# Patient Record
Sex: Male | Born: 1970 | Race: Asian | Hispanic: No | Marital: Married | Smoking: Never smoker
Health system: Southern US, Community
[De-identification: ages and names within clinical notes are randomized; demographics above are authoritative.]

---

## 2013-11-19 ENCOUNTER — Encounter (HOSPITAL_COMMUNITY): Payer: Self-pay | Admitting: Emergency Medicine

## 2013-11-19 ENCOUNTER — Emergency Department (HOSPITAL_COMMUNITY)
Admission: EM | Admit: 2013-11-19 | Discharge: 2013-11-19 | Disposition: A | Discharge status: Home / Self Care | Payer: Medicaid Other | Source: Home / Self Care | Attending: Family Medicine | Admitting: Family Medicine

## 2013-11-19 DIAGNOSIS — S39012A Strain of muscle, fascia and tendon of lower back, initial encounter: Secondary | ICD-10-CM

## 2013-11-19 DIAGNOSIS — X58XXXA Exposure to other specified factors, initial encounter: Secondary | ICD-10-CM

## 2013-11-19 DIAGNOSIS — S335XXA Sprain of ligaments of lumbar spine, initial encounter: Secondary | ICD-10-CM

## 2013-11-19 MED ORDER — METAXALONE 800 MG PO TABS: 800.0000 mg | ORAL_TABLET | Freq: Three times a day (TID) | ORAL | Status: AC

## 2013-11-19 NOTE — ED Provider Notes (Signed)
CSN: [id number]     Arrival date & time 11/19/13  1001 History   First MD Initiated Contact with Patient 11/19/13 1057     Chief Complaint  Patient presents with  . Back Pain   (Consider location/radiation/quality/duration/timing/severity/associated sxs/prior Treatment) Patient is a 43 y.o. male presenting with back pain. The history is provided by the patient. The history is limited by a language barrier. Language interpreter used: friend translated.  Back Pain Location:  Lumbar spine Quality:  Stiffness Radiates to:  Does not radiate Pain severity:  Mild Progression:  Unchanged Chronicity:  Chronic Context: lifting heavy objects   Context comment:  Hurt back 10 yrs ago in refugee camp in [place], has hurt since. no neuro sx. Relieved by:  None tried Worsened by:  Nothing tried Ineffective treatments:  None tried Associated symptoms: no abdominal pain, no leg pain, no numbness, no pelvic pain and no tingling     History reviewed. No pertinent past medical history. History reviewed. No pertinent past surgical history. No family history on file. History  Substance Use Topics  . Smoking status: Never Smoker   . Smokeless tobacco: Not on file  . Alcohol Use: No    Review of Systems  Gastrointestinal: Negative.  Negative for abdominal pain.  Genitourinary: Negative.  Negative for pelvic pain.  Musculoskeletal: Positive for back pain. Negative for gait problem, joint swelling and myalgias.  Skin: Negative.   Neurological: Negative for tingling and numbness.    Allergies  Review of patient's allergies indicates no known allergies.  Home Medications   Prior to Admission medications   Medication Sig Start Date End Date Taking? Authorizing Provider  metaxalone (SKELAXIN) 800 MG tablet Take 1 tablet (800 mg total) by mouth 3 (three) times daily. As muscle relaxer. 11/19/13   Linna HoffJames D Chrisette Man, MD   BP 130/87  Pulse 67  Temp(Src) 99.2 F (37.3 C) (Oral)  Resp 14  SpO2  100% Physical Exam  Nursing note and vitals reviewed. Constitutional: He is oriented to person, place, and time. He appears well-developed and well-nourished.  Neck: Normal range of motion. Neck supple.  Abdominal: Soft. Bowel sounds are normal.  Musculoskeletal: He exhibits tenderness.       Lumbar back: He exhibits tenderness and pain. He exhibits normal range of motion, no spasm and normal pulse.  Neurological: He is alert and oriented to person, place, and time.  Skin: Skin is warm and dry.    ED Course  Procedures (including critical care time) Labs Review Labs Reviewed - No data to display  Imaging Review No results found.   MDM   1. Low back strain, initial encounter        Linna HoffJames D Xian Apostol, MD 11/19/13 1121

## 2013-11-19 NOTE — ED Notes (Signed)
Via Clydie BraunKaren interpreter (friend) ... Pt c/o lower back pain x10 years Arrived from [place] x1 month ago Denies urinary sx Alert w/no signs of acute distress.

## 2013-12-02 ENCOUNTER — Ambulatory Visit: Payer: Self-pay

## 2013-12-26 ENCOUNTER — Ambulatory Visit: Payer: Medicaid Other | Attending: Internal Medicine | Admitting: Internal Medicine

## 2013-12-26 ENCOUNTER — Encounter: Payer: Self-pay | Admitting: Internal Medicine

## 2013-12-26 VITALS — BP 131/91 | HR 79 | Temp 97.7°F | Resp 16 | Ht 64.0 in | Wt 129.0 lb

## 2013-12-26 DIAGNOSIS — R5382 Chronic fatigue, unspecified: Secondary | ICD-10-CM | POA: Insufficient documentation

## 2013-12-26 DIAGNOSIS — R5383 Other fatigue: Secondary | ICD-10-CM | POA: Diagnosis not present

## 2013-12-26 DIAGNOSIS — M545 Low back pain, unspecified: Secondary | ICD-10-CM | POA: Diagnosis not present

## 2013-12-26 DIAGNOSIS — R5381 Other malaise: Secondary | ICD-10-CM | POA: Diagnosis not present

## 2013-12-26 LAB — CBC WITH DIFFERENTIAL/PLATELET
BASOS ABS: 0 10*3/uL (ref 0.0–0.1)
Basophils Relative: 0 % (ref 0–1)
EOS PCT: 3 % (ref 0–5)
Eosinophils Absolute: 0.2 10*3/uL (ref 0.0–0.7)
HEMATOCRIT: 39.1 % (ref 39.0–52.0)
Hemoglobin: 14 g/dL (ref 13.0–17.0)
LYMPHS PCT: 28 % (ref 12–46)
Lymphs Abs: 1.6 10*3/uL (ref 0.7–4.0)
MCH: 30.2 pg (ref 26.0–34.0)
MCHC: 35.8 g/dL (ref 30.0–36.0)
MCV: 84.3 fL (ref 78.0–100.0)
Monocytes Absolute: 0.4 10*3/uL (ref 0.1–1.0)
Monocytes Relative: 7 % (ref 3–12)
Neutro Abs: 3.5 10*3/uL (ref 1.7–7.7)
Neutrophils Relative %: 62 % (ref 43–77)
Platelets: 210 10*3/uL (ref 150–400)
RBC: 4.64 MIL/uL (ref 4.22–5.81)
RDW: 13.5 % (ref 11.5–15.5)
WBC: 5.7 10*3/uL (ref 4.0–10.5)

## 2013-12-26 LAB — POCT GLYCOSYLATED HEMOGLOBIN (HGB A1C): HEMOGLOBIN A1C: 5.5

## 2013-12-26 MED ORDER — NAPROXEN 500 MG PO TABS: 500.0000 mg | ORAL_TABLET | Freq: Two times a day (BID) | ORAL | Status: DC

## 2013-12-26 NOTE — Progress Notes (Signed)
Pt here to establish care for chronic lower back pain,nonradiating.denies injury States pain has been ongoing in prior country Pt c/o allergy symptoms C/o urine frequency,dark yellow Clydie BraunKaren interpretor present

## 2013-12-26 NOTE — Progress Notes (Signed)
Patient ID: Jose Gilmore, male   DOB: January 31, 1971, 43 y.o.   MRN: [id number]   Jose Gilmore, is a 43 y.o. male  [id number]  [id number]  DOB - January 31, 1971  CC:  Chief Complaint  Patient presents with  . Establish Care  . Back Pain  . Urinary Frequency  . Sinusitis       HPI: Jose Gilmore is a 43 y.o. male here today to establish medical care. Patient recently relocated from [place] ago, he has been experiencing low back pain for about 10 years but no significant finding from previous health care visit. Back pain is rated about 8/10, associated with some stiffness no radiation, he remembered in about 10 years ago while lifting objects heard a click sound, this happened at the refugee camp in [place]. Patient is not on any medication, has no significant past medical history. No urinary or fecal incontinence, no tingling or weakness in the lower extremities, patient is able to walk long distance though with pain in the back. Patient does not smoke cigarettes he does not drink alcohol. Patient has No headache, No chest pain, No abdominal pain - No Nausea, No new weakness tingling or numbness, No Cough - SOB.  No Known Allergies History reviewed. No pertinent past medical history. Current Outpatient Prescriptions on File Prior to Visit  Medication Sig Dispense Refill  . metaxalone (SKELAXIN) 800 MG tablet Take 1 tablet (800 mg total) by mouth 3 (three) times daily. As muscle relaxer.  30 tablet  0   No current facility-administered medications on file prior to visit.   History reviewed. No pertinent family history. History   Social History  . Marital Status: Married    Spouse Name: N/A    Number of Children: N/A  . Years of Education: N/A   Occupational History  . Not on file.   Social History Main Topics  . Smoking status: Never Smoker   . Smokeless tobacco: Not on file  . Alcohol Use: No  . Drug Use: No  . Sexual Activity: Not on file   Other  Topics Concern  . Not on file   Social History Narrative  . No narrative on file    Review of Systems: Constitutional: Negative for fever, chills, diaphoresis, activity change, appetite change and fatigue. HENT: Negative for ear pain, nosebleeds, congestion, facial swelling, rhinorrhea, neck pain, neck stiffness and ear discharge.  Eyes: Negative for pain, discharge, redness, itching and visual disturbance. Respiratory: Negative for cough, choking, chest tightness, shortness of breath, wheezing and stridor.  Cardiovascular: Negative for chest pain, palpitations and leg swelling. Gastrointestinal: Negative for abdominal distention. Genitourinary: Negative for dysuria, urgency, frequency, hematuria, flank pain, decreased urine volume, difficulty urinating and dyspareunia.  Musculoskeletal: Positive for back pain, negative for joint swelling, arthralgia and gait problem. Neurological: Negative for dizziness, tremors, seizures, syncope, facial asymmetry, speech difficulty, weakness, light-headedness, numbness and headaches.  Hematological: Negative for adenopathy. Does not bruise/bleed easily. Psychiatric/Behavioral: Negative for hallucinations, behavioral problems, confusion, dysphoric mood, decreased concentration and agitation.    Objective:   Filed Vitals:   12/26/13 1652  BP: 131/91  Pulse: 79  Temp: 97.7 F (36.5 C)  Resp: 16    Physical Exam: Constitutional: Patient appears well-developed and well-nourished. No distress. HENT: Normocephalic, atraumatic, External right and left ear normal. Oropharynx is clear and moist.  Eyes: Conjunctivae and EOM are normal. PERRLA, no scleral icterus. Neck: Normal ROM. Neck supple. No JVD. No tracheal deviation. No thyromegaly. CVS:  RRR, S1/S2 +, no murmurs, no gallops, no carotid bruit.  Pulmonary: Effort and breath sounds normal, no stridor, rhonchi, wheezes, rales.  Abdominal: Soft. BS +, no distension, tenderness, rebound or guarding.    Musculoskeletal: Normal range of motion. No edema and no tenderness. Negative leg raising sign  Lymphadenopathy: No lymphadenopathy noted, cervical, inguinal or axillary Neuro: Alert. Normal reflexes, muscle tone coordination. No cranial nerve deficit. Skin: Skin is warm and dry. No rash noted. Not diaphoretic. No erythema. No pallor. Psychiatric: Normal mood and affect. Behavior, judgment, thought content normal.  No results found for this basename: WBC, HGB, HCT, MCV, PLT   No results found for this basename: CREATININE, BUN, NA, K, CL, CO2    No results found for this basename: HGBA1C   Lipid Panel  No results found for this basename: chol, trig, hdl, cholhdl, vldl, ldlcalc       Assessment and plan:   1. Bilateral low back pain without sciatica  - CBC with Differential - COMPLETE METABOLIC PANEL WITH GFR - POCT glycosylated hemoglobin (Hb A1C) - Lipid panel - TSH - Urinalysis, Complete  - naproxen (NAPROSYN) 500 MG tablet; Take 1 tablet (500 mg total) by mouth 2 (two) times daily with a meal.  Dispense: 30 tablet; Refill: 0  2. Chronic fatigue  To review laboratory tests results especially TSH and CBC Patient was extensively counseled about nutrition and exercise  Interpreter was used to communicate directly with patient for the entire encounter including providing detailed patient instructions.   Return in about 6 months (around 06/28/2014), or if symptoms worsen or fail to improve, for Follow up Pain and comorbidities.  The patient was given clear instructions to go to ER or return to medical center if symptoms don't improve, worsen or new problems develop. The patient verbalized understanding. The patient was told to call to get lab results if they haven't heard anything in the next week.     This note has been created with Education officer, environmental. Any transcriptional errors are unintentional.    Jeanann Lewandowsky, MD, MHA,  FACP, FAAP [facility] [place], [place] [phone]   12/26/2013, 5:21 PM

## 2013-12-26 NOTE — Patient Instructions (Signed)
Back Pain, Adult Low back pain is very common. About 1 in 5 people have back pain.The cause of low back pain is rarely dangerous. The pain often gets better over time.About half of people with a sudden onset of back pain feel better in just 2 weeks. About 8 in 10 people feel better by 6 weeks.  CAUSES Some common causes of back pain include:  Strain of the muscles or ligaments supporting the spine.  Wear and tear (degeneration) of the spinal discs.  Arthritis.  Direct injury to the back. DIAGNOSIS Most of the time, the direct cause of low back pain is not known.However, back pain can be treated effectively even when the exact cause of the pain is unknown.Answering your caregiver's questions about your overall health and symptoms is one of the most accurate ways to make sure the cause of your pain is not dangerous. If your caregiver needs more information, he or she may order lab work or imaging tests (X-rays or MRIs).However, even if imaging tests show changes in your back, this usually does not require surgery. HOME CARE INSTRUCTIONS For many people, back pain returns.Since low back pain is rarely dangerous, it is often a condition that people can learn to manageon their own.   Remain active. It is stressful on the back to sit or stand in one place. Do not sit, drive, or stand in one place for more than 30 minutes at a time. Take short walks on level surfaces as soon as pain allows.Try to increase the length of time you walk each day.  Do not stay in bed.Resting more than 1 or 2 days can delay your recovery.  Do not avoid exercise or work.Your body is made to move.It is not dangerous to be active, even though your back may hurt.Your back will likely heal faster if you return to being active before your pain is gone.  Pay attention to your body when you bend and lift. Many people have less discomfortwhen lifting if they bend their knees, keep the load close to their bodies,and  avoid twisting. Often, the most comfortable positions are those that put less stress on your recovering back.  Find a comfortable position to sleep. Use a firm mattress and lie on your side with your knees slightly bent. If you lie on your back, put a pillow under your knees.  Only take over-the-counter or prescription medicines as directed by your caregiver. Over-the-counter medicines to reduce pain and inflammation are often the most helpful.Your caregiver may prescribe muscle relaxant drugs.These medicines help dull your pain so you can more quickly return to your normal activities and healthy exercise.  Put ice on the injured area.  Put ice in a plastic bag.  Place a towel between your skin and the bag.  Leave the ice on for 15-20 minutes, 03-04 times a day for the first 2 to 3 days. After that, ice and heat may be alternated to reduce pain and spasms.  Ask your caregiver about trying back exercises and gentle massage. This may be of some benefit.  Avoid feeling anxious or stressed.Stress increases muscle tension and can worsen back pain.It is important to recognize when you are anxious or stressed and learn ways to manage it.Exercise is a great option. SEEK MEDICAL CARE IF:  You have pain that is not relieved with rest or medicine.  You have pain that does not improve in 1 week.  You have new symptoms.  You are generally not feeling well. SEEK   IMMEDIATE MEDICAL CARE IF:   You have pain that radiates from your back into your legs.  You develop new bowel or bladder control problems.  You have unusual weakness or numbness in your arms or legs.  You develop nausea or vomiting.  You develop abdominal pain.  You feel faint. Document Released: 04/25/2005 Document Revised: 10/25/2011 Document Reviewed: 08/27/2013 ExitCare Patient Information 2015 [employer]. This information is not intended to replace advice given to you by your health care provider. Make sure you  discuss any questions you have with your health care provider.  

## 2013-12-27 LAB — COMPLETE METABOLIC PANEL WITH GFR
ALT: 15 U/L (ref 0–53)
AST: 19 U/L (ref 0–37)
Albumin: 4.4 g/dL (ref 3.5–5.2)
Alkaline Phosphatase: 66 U/L (ref 39–117)
BUN: 17 mg/dL (ref 6–23)
CALCIUM: 9.3 mg/dL (ref 8.4–10.5)
CHLORIDE: 102 meq/L (ref 96–112)
CO2: 27 meq/L (ref 19–32)
Creat: 1.12 mg/dL (ref 0.50–1.35)
GFR, Est Non African American: 81 mL/min
Glucose, Bld: 130 mg/dL — ABNORMAL HIGH (ref 70–99)
Potassium: 4 mEq/L (ref 3.5–5.3)
Sodium: 138 mEq/L (ref 135–145)
Total Bilirubin: 0.4 mg/dL (ref 0.2–1.2)
Total Protein: 7.1 g/dL (ref 6.0–8.3)

## 2013-12-27 LAB — URINALYSIS, COMPLETE
Bacteria, UA: NONE SEEN
Bilirubin Urine: NEGATIVE
CASTS: NONE SEEN
Crystals: NONE SEEN
GLUCOSE, UA: NEGATIVE mg/dL
Hgb urine dipstick: NEGATIVE
Ketones, ur: NEGATIVE mg/dL
LEUKOCYTES UA: NEGATIVE
Nitrite: NEGATIVE
PH: 5.5 (ref 5.0–8.0)
Protein, ur: NEGATIVE mg/dL
SQUAMOUS EPITHELIAL / LPF: NONE SEEN
Specific Gravity, Urine: 1.029 (ref 1.005–1.030)
Urobilinogen, UA: 0.2 mg/dL (ref 0.0–1.0)

## 2013-12-27 LAB — LIPID PANEL
CHOLESTEROL: 234 mg/dL — AB (ref 0–200)
HDL: 51 mg/dL (ref >39)
Total CHOL/HDL Ratio: 4.6 Ratio
Triglycerides: 534 mg/dL — ABNORMAL HIGH (ref <150)

## 2013-12-27 LAB — TSH: TSH: 3.691 u[IU]/mL (ref 0.350–4.500)

## 2013-12-30 ENCOUNTER — Telehealth: Payer: Self-pay | Admitting: Emergency Medicine

## 2013-12-30 MED ORDER — GEMFIBROZIL 600 MG PO TABS: 600.0000 mg | ORAL_TABLET | Freq: Two times a day (BID) | ORAL | Status: DC

## 2013-12-30 NOTE — Telephone Encounter (Signed)
Pt family member Hser given lab results per permission for Translation.medication ordered and e-scribed to [facility] pharmacy] Script Gemfibrozil 600 mg tab BID sent to pharmacy

## 2013-12-30 NOTE — Telephone Encounter (Signed)
Message copied by Darlis Loan on Mon Dec 30, 2013  4:06 PM ------      Message from: Quentin Angst      Created: Fri Dec 27, 2013  9:28 AM       Please inform patient that his laboratory tests results are mostly within normal limit except for his triglyceride, level which is a form of cholesterol. His urine test is normal with no evidence of infection. We'll prescribe medication for his triglyceride and recheck again in 6 months.            Please correlate gemfibrozil 600 mg tablet by mouth twice a day, quantity 60 tablets with 2 refills ------

## 2014-01-07 ENCOUNTER — Ambulatory Visit (HOSPITAL_COMMUNITY)
Admission: RE | Admit: 2014-01-07 | Discharge: 2014-01-07 | Disposition: A | Discharge status: Home / Self Care | Payer: Medicaid Other | Source: Ambulatory Visit | Attending: Internal Medicine | Admitting: Internal Medicine

## 2014-01-07 DIAGNOSIS — R5383 Other fatigue: Secondary | ICD-10-CM

## 2014-01-07 DIAGNOSIS — R05 Cough: Secondary | ICD-10-CM | POA: Insufficient documentation

## 2014-01-07 DIAGNOSIS — R059 Cough, unspecified: Secondary | ICD-10-CM | POA: Diagnosis not present

## 2014-01-07 DIAGNOSIS — R5381 Other malaise: Secondary | ICD-10-CM | POA: Diagnosis not present

## 2014-01-07 DIAGNOSIS — R5382 Chronic fatigue, unspecified: Secondary | ICD-10-CM

## 2014-01-14 ENCOUNTER — Telehealth: Payer: Self-pay | Admitting: Emergency Medicine

## 2014-01-14 NOTE — Telephone Encounter (Signed)
Message copied by Darlis Loan on Tue Jan 14, 2014  2:01 PM ------      Message from: Quentin Angst      Created: Tue Jan 14, 2014  9:42 AM       Please inform patient that his chest x-ray is normal ------

## 2014-01-16 NOTE — Telephone Encounter (Signed)
Mr. Jose Gilmore on behalf of the Paitient (brother in law) would like to know the results of the X-rays done last week and if the Patient needs to come for a doctor's visit and when. Patient referred medication is not helping with back pain and limitations like he cannot stand up for a long time, sit down or walk. Please f/u with Mr. Jose Gilmore (he speaks [place]).

## 2014-01-20 ENCOUNTER — Telehealth: Payer: Self-pay | Admitting: Internal Medicine

## 2014-01-20 NOTE — Telephone Encounter (Signed)
Pt.'s sister in law is calling for pt. Stating that pt. Has been having back pain for 10 years and has not been getting better. Pt's sister in law states that pt. Has lost a lot of weight and it is hard for pt. To lay down. Sister in law states that pt. Does not speak English and she will be able to interpret for him. Please f/u with sister in law at [phone]. Sister in law states that the best time to call is in the afternoon after 1 p.m.

## 2014-01-28 ENCOUNTER — Telehealth: Payer: Self-pay | Admitting: Emergency Medicine

## 2014-01-28 NOTE — Telephone Encounter (Signed)
Pt.'s sister in law is calling for pt. Stating that pt. Has been having back pain for 10 years and has not been getting better. Pt's sister in law states that pt. Has lost a lot of weight and it is hard for pt. To lay down. Sister in law states that pt. Does not speak English and she will be able to interpret for him. Please f/u with sister in law at [phone]. Sister in law states that the best time to call is in the afternoon after 1 p.m.    Left message for sister in law to return call

## 2014-02-13 ENCOUNTER — Ambulatory Visit (HOSPITAL_COMMUNITY)
Admission: RE | Admit: 2014-02-13 | Discharge: 2014-02-13 | Disposition: A | Discharge status: Home / Self Care | Payer: Medicaid Other | Source: Ambulatory Visit | Attending: Internal Medicine | Admitting: Internal Medicine

## 2014-02-13 ENCOUNTER — Ambulatory Visit: Payer: Medicaid Other | Attending: Internal Medicine | Admitting: Internal Medicine

## 2014-02-13 ENCOUNTER — Encounter: Payer: Self-pay | Admitting: Internal Medicine

## 2014-02-13 VITALS — BP 137/90 | HR 78 | Temp 98.4°F | Resp 14 | Ht 64.0 in | Wt 125.0 lb

## 2014-02-13 DIAGNOSIS — R200 Anesthesia of skin: Secondary | ICD-10-CM | POA: Diagnosis not present

## 2014-02-13 DIAGNOSIS — F32A Depression, unspecified: Secondary | ICD-10-CM | POA: Insufficient documentation

## 2014-02-13 DIAGNOSIS — R5382 Chronic fatigue, unspecified: Secondary | ICD-10-CM | POA: Insufficient documentation

## 2014-02-13 DIAGNOSIS — M545 Low back pain, unspecified: Secondary | ICD-10-CM

## 2014-02-13 DIAGNOSIS — F329 Major depressive disorder, single episode, unspecified: Secondary | ICD-10-CM | POA: Diagnosis not present

## 2014-02-13 DIAGNOSIS — G8929 Other chronic pain: Secondary | ICD-10-CM | POA: Diagnosis not present

## 2014-02-13 MED ORDER — NAPROXEN 500 MG PO TABS: 500.0000 mg | ORAL_TABLET | Freq: Two times a day (BID) | ORAL | Status: DC

## 2014-02-13 MED ORDER — CYCLOBENZAPRINE HCL 10 MG PO TABS: 10.0000 mg | ORAL_TABLET | Freq: Three times a day (TID) | ORAL | Status: DC | PRN

## 2014-02-13 MED ORDER — AMITRIPTYLINE HCL 50 MG PO TABS: 50.0000 mg | ORAL_TABLET | Freq: Every day | ORAL | Status: DC

## 2014-02-13 NOTE — Progress Notes (Signed)
Patient ID: Jose Gilmore, male   DOB: 1970/09/21, 43 y.o.   MRN: [id number]   Jose Gilmore, is a 43 y.o. male  [id number]  [id number]  DOB - [birth year]  Chief Complaint  Patient presents with  . Follow-up        Subjective:   Jose Gilmore is a 43 y.o. male here today for a follow up visit.Pt is here following up on his chronic lower back pain. Pt reports being in extreme pain with no relief. No history of trauma, no history of fall. Pt is suffering from severe depression but denies suicidal ideation or thoughts. Patient has No headache, No chest pain, No abdominal pain - No Nausea, No new weakness tingling or numbness, No Cough - SOB.  Problem  Depression    ALLERGIES: No Known Allergies  PAST MEDICAL HISTORY: History reviewed. No pertinent past medical history.  MEDICATIONS AT HOME: Prior to Admission medications   Medication Sig Start Date End Date Taking? Authorizing Provider  amitriptyline (ELAVIL) 50 MG tablet Take 1 tablet (50 mg total) by mouth at bedtime. 02/13/14   Quentin Angst, MD  cyclobenzaprine (FLEXERIL) 10 MG tablet Take 1 tablet (10 mg total) by mouth 3 (three) times daily as needed for muscle spasms. 02/13/14   Quentin Angst, MD  gemfibrozil (LOPID) 600 MG tablet Take 1 tablet (600 mg total) by mouth 2 (two) times daily before a meal. 12/30/13   Quentin Angst, MD  metaxalone (SKELAXIN) 800 MG tablet Take 1 tablet (800 mg total) by mouth 3 (three) times daily. As muscle relaxer. 11/19/13   Linna Hoff, MD  naproxen (NAPROSYN) 500 MG tablet Take 1 tablet (500 mg total) by mouth 2 (two) times daily with a meal. 02/13/14   Quentin Angst, MD     Objective:   Filed Vitals:   02/13/14 1452  BP: 137/90  Pulse: 78  Temp: 98.4 F (36.9 C)  TempSrc: Oral  Resp: 14  Height: 5\' 4"  (1.626 m)  Weight: 125 lb (56.7 kg)  SpO2: 100%    Exam General appearance : Awake, alert, not in any distress. Speech Clear. Not toxic looking HEENT:  Atraumatic and Normocephalic, pupils equally reactive to light and accomodation Neck: supple, no JVD. No cervical lymphadenopathy.  Chest:Good air entry bilaterally, no added sounds  CVS: S1 S2 regular, no murmurs.  Abdomen: Bowel sounds present, Non tender and not distended with no gaurding, rigidity or rebound. Extremities: B/L Lower Ext shows no edema, both legs are warm to touch Neurology: Awake alert, and oriented X 3, CN II-XII intact, Non focal  Data Review Lab Results  Component Value Date   HGBA1C 5.5 12/26/2013     Assessment & Plan   1. Bilateral low back pain without sciatica  - cyclobenzaprine (FLEXERIL) 10 MG tablet; Take 1 tablet (10 mg total) by mouth 3 (three) times daily as needed for muscle spasms.  Dispense: 60 tablet; Refill: 0 - naproxen (NAPROSYN) 500 MG tablet; Take 1 tablet (500 mg total) by mouth 2 (two) times daily with a meal.  Dispense: 30 tablet; Refill: 0  - DG Lumbar Spine Complete; Future  2. Chronic fatigue  This probably is as a result of depression, with treatment with amitriptyline which would also serve as pain control  3. Depression  - amitriptyline (ELAVIL) 50 MG tablet; Take 1 tablet (50 mg total) by mouth at bedtime.  Dispense: 30 tablet; Refill: 3  Interpreter was used to communicate directly with patient for the  entire encounter including providing detailed patient instructions.  Return in about 6 months (around 08/15/2014) for Follow up Pain and comorbidities.  The patient was given clear instructions to go to ER or return to medical center if symptoms don't improve, worsen or new problems develop. The patient verbalized understanding. The patient was told to call to get lab results if they haven't heard anything in the next week.   This note has been created with Education officer, environmentalDragon speech recognition software and smart phrase technology. Any transcriptional errors are unintentional.    Jeanann LewandowskyJEGEDE, Nollie Shiflett, MD, MHA, FACP, FAAP [facility]  [facility] [place] , [place] [phone]   02/13/2014, 3:29 PM

## 2014-02-13 NOTE — Patient Instructions (Signed)
Back Pain, Adult Low back pain is very common. About 1 in 5 people have back pain.The cause of low back pain is rarely dangerous. The pain often gets better over time.About half of people with a sudden onset of back pain feel better in just 2 weeks. About 8 in 10 people feel better by 6 weeks.  CAUSES Some common causes of back pain include:  Strain of the muscles or ligaments supporting the spine.  Wear and tear (degeneration) of the spinal discs.  Arthritis.  Direct injury to the back. DIAGNOSIS Most of the time, the direct cause of low back pain is not known.However, back pain can be treated effectively even when the exact cause of the pain is unknown.Answering your caregiver's questions about your overall health and symptoms is one of the most accurate ways to make sure the cause of your pain is not dangerous. If your caregiver needs more information, he or she may order lab work or imaging tests (X-rays or MRIs).However, even if imaging tests show changes in your back, this usually does not require surgery. HOME CARE INSTRUCTIONS For many people, back pain returns.Since low back pain is rarely dangerous, it is often a condition that people can learn to manageon their own.   Remain active. It is stressful on the back to sit or stand in one place. Do not sit, drive, or stand in one place for more than 30 minutes at a time. Take short walks on level surfaces as soon as pain allows.Try to increase the length of time you walk each day.  Do not stay in bed.Resting more than 1 or 2 days can delay your recovery.  Do not avoid exercise or work.Your body is made to move.It is not dangerous to be active, even though your back may hurt.Your back will likely heal faster if you return to being active before your pain is gone.  Pay attention to your body when you bend and lift. Many people have less discomfortwhen lifting if they bend their knees, keep the load close to their bodies,and  avoid twisting. Often, the most comfortable positions are those that put less stress on your recovering back.  Find a comfortable position to sleep. Use a firm mattress and lie on your side with your knees slightly bent. If you lie on your back, put a pillow under your knees.  Only take over-the-counter or prescription medicines as directed by your caregiver. Over-the-counter medicines to reduce pain and inflammation are often the most helpful.Your caregiver may prescribe muscle relaxant drugs.These medicines help dull your pain so you can more quickly return to your normal activities and healthy exercise.  Put ice on the injured area.  Put ice in a plastic bag.  Place a towel between your skin and the bag.  Leave the ice on for 15-20 minutes, 03-04 times a day for the first 2 to 3 days. After that, ice and heat may be alternated to reduce pain and spasms.  Ask your caregiver about trying back exercises and gentle massage. This may be of some benefit.  Avoid feeling anxious or stressed.Stress increases muscle tension and can worsen back pain.It is important to recognize when you are anxious or stressed and learn ways to manage it.Exercise is a great option. SEEK MEDICAL CARE IF:  You have pain that is not relieved with rest or medicine.  You have pain that does not improve in 1 week.  You have new symptoms.  You are generally not feeling well. SEEK   IMMEDIATE MEDICAL CARE IF:   You have pain that radiates from your back into your legs.  You develop new bowel or bladder control problems.  You have unusual weakness or numbness in your arms or legs.  You develop nausea or vomiting.  You develop abdominal pain.  You feel faint. Document Released: 04/25/2005 Document Revised: 10/25/2011 Document Reviewed: 08/27/2013 ExitCare Patient Information 2015 [employer]. This information is not intended to replace advice given to you by your health care provider. Make sure you  discuss any questions you have with your health care provider.  

## 2014-02-13 NOTE — Progress Notes (Signed)
Pt is here following up on his chronic lower back pain. Pt reports being in extreme pain with no relief. Pt is suffering from severe depression.   Pt has an interpreter.

## 2014-02-20 ENCOUNTER — Telehealth: Payer: Self-pay | Admitting: Emergency Medicine

## 2014-02-20 DIAGNOSIS — M545 Low back pain, unspecified: Secondary | ICD-10-CM

## 2014-02-20 NOTE — Telephone Encounter (Signed)
Pt given xray results with scheduled [facility] appointment [facility] 03/11/14 @ 245 pm Pt verbalized understanding

## 2014-02-20 NOTE — Telephone Encounter (Signed)
Message copied by Darlis LoanSMITH, JILL D on Thu Feb 20, 2014  4:47 PM ------      Message from: Jeanann LewandowskyJEGEDE, OLUGBEMIGA E      Created: Wed Feb 19, 2014 11:33 AM       Please inform patient that his lumbar spine x-ray shows degenerative disease. We will like to investigate further with MRI.            As scheduled patient for MRI lumbar spine. ------

## 2014-03-11 ENCOUNTER — Ambulatory Visit (HOSPITAL_COMMUNITY)
Admission: RE | Admit: 2014-03-11 | Discharge: 2014-03-11 | Disposition: A | Discharge status: Home / Self Care | Payer: Medicaid Other | Source: Ambulatory Visit | Attending: Radiology | Admitting: Radiology

## 2014-03-11 DIAGNOSIS — M5126 Other intervertebral disc displacement, lumbar region: Secondary | ICD-10-CM | POA: Diagnosis not present

## 2014-03-11 DIAGNOSIS — M545 Low back pain, unspecified: Secondary | ICD-10-CM

## 2014-03-11 DIAGNOSIS — M5127 Other intervertebral disc displacement, lumbosacral region: Secondary | ICD-10-CM | POA: Diagnosis not present

## 2014-03-24 ENCOUNTER — Telehealth: Payer: Self-pay | Admitting: Emergency Medicine

## 2014-03-24 NOTE — Telephone Encounter (Signed)
-----   Message from Quentin Angstlugbemiga E Jegede, MD sent at 03/16/2014  7:17 PM EST ----- Please inform patient that the MRI shows significant abnormalities including nerve impingement, we will refer him to a neurosurgeon for further evaluation and treatment.  Please please refer to neurosurgery for severe low back pain with abnormal MRI.

## 2014-04-24 ENCOUNTER — Other Ambulatory Visit: Payer: Self-pay | Admitting: Internal Medicine

## 2014-04-24 DIAGNOSIS — M4807 Spinal stenosis, lumbosacral region: Secondary | ICD-10-CM

## 2014-06-06 ENCOUNTER — Encounter (HOSPITAL_COMMUNITY): Admission: RE | Payer: Self-pay | Source: Ambulatory Visit

## 2014-06-06 ENCOUNTER — Ambulatory Visit (HOSPITAL_COMMUNITY): Admission: RE | Admit: 2014-06-06 | Payer: Medicaid Other | Source: Ambulatory Visit | Admitting: Neurosurgery

## 2014-06-06 SURGERY — LUMBAR LAMINECTOMY/DECOMPRESSION MICRODISCECTOMY 1 LEVEL
Anesthesia: General | Site: Back | Laterality: Left

## 2014-12-04 ENCOUNTER — Ambulatory Visit: Payer: Self-pay | Attending: Internal Medicine | Admitting: Internal Medicine

## 2014-12-04 ENCOUNTER — Encounter: Payer: Self-pay | Admitting: Internal Medicine

## 2014-12-04 VITALS — BP 128/80 | HR 74 | Temp 98.5°F | Resp 18 | Ht 64.5 in | Wt 126.8 lb

## 2014-12-04 DIAGNOSIS — M545 Low back pain, unspecified: Secondary | ICD-10-CM

## 2014-12-04 DIAGNOSIS — E781 Pure hyperglyceridemia: Secondary | ICD-10-CM

## 2014-12-04 DIAGNOSIS — F329 Major depressive disorder, single episode, unspecified: Secondary | ICD-10-CM

## 2014-12-04 DIAGNOSIS — R5382 Chronic fatigue, unspecified: Secondary | ICD-10-CM

## 2014-12-04 DIAGNOSIS — F32A Depression, unspecified: Secondary | ICD-10-CM

## 2014-12-04 LAB — CBC WITH DIFFERENTIAL/PLATELET
BASOS PCT: 1 % (ref 0–1)
Basophils Absolute: 0 10*3/uL (ref 0.0–0.1)
EOS ABS: 0.1 10*3/uL (ref 0.0–0.7)
Eosinophils Relative: 3 % (ref 0–5)
HCT: 40.2 % (ref 39.0–52.0)
HEMOGLOBIN: 13.9 g/dL (ref 13.0–17.0)
LYMPHS ABS: 1.2 10*3/uL (ref 0.7–4.0)
LYMPHS PCT: 29 % (ref 12–46)
MCH: 29.3 pg (ref 26.0–34.0)
MCHC: 34.6 g/dL (ref 30.0–36.0)
MCV: 84.8 fL (ref 78.0–100.0)
MONOS PCT: 6 % (ref 3–12)
MPV: 10.4 fL (ref 8.6–12.4)
Monocytes Absolute: 0.2 10*3/uL (ref 0.1–1.0)
NEUTROS ABS: 2.4 10*3/uL (ref 1.7–7.7)
NEUTROS PCT: 61 % (ref 43–77)
PLATELETS: 188 10*3/uL (ref 150–400)
RBC: 4.74 MIL/uL (ref 4.22–5.81)
RDW: 13.1 % (ref 11.5–15.5)
WBC: 4 10*3/uL (ref 4.0–10.5)

## 2014-12-04 LAB — LIPID PANEL
Cholesterol: 213 mg/dL — ABNORMAL HIGH (ref 125–200)
HDL: 63 mg/dL (ref >=40)
LDL Cholesterol: 123 mg/dL (ref <130)
TRIGLYCERIDES: 133 mg/dL (ref <150)
Total CHOL/HDL Ratio: 3.4 Ratio (ref <=5.0)
VLDL: 27 mg/dL (ref <30)

## 2014-12-04 LAB — TSH: TSH: 2.519 u[IU]/mL (ref 0.350–4.500)

## 2014-12-04 MED ORDER — NAPROXEN 500 MG PO TABS: 500.0000 mg | ORAL_TABLET | Freq: Two times a day (BID) | ORAL | Status: AC

## 2014-12-04 MED ORDER — AMITRIPTYLINE HCL 50 MG PO TABS: 50.0000 mg | ORAL_TABLET | Freq: Every day | ORAL | Status: AC

## 2014-12-04 MED ORDER — CYCLOBENZAPRINE HCL 10 MG PO TABS: 10.0000 mg | ORAL_TABLET | Freq: Three times a day (TID) | ORAL | Status: AC | PRN

## 2014-12-04 MED ORDER — GEMFIBROZIL 600 MG PO TABS: 600.0000 mg | ORAL_TABLET | Freq: Two times a day (BID) | ORAL | Status: AC

## 2014-12-04 NOTE — Progress Notes (Signed)
Patient ID: Jose Gilmore, male   DOB: 1971-03-30, 44 y.o.   MRN: [id number]   Jose Gilmore, is a 44 y.o. male  [id number]:[id number]  [id number]  DOB - Apr 27, 1971  Chief Complaint  Patient presents with  . Fatigue  . Back Pain        Subjective:   Jose Gilmore is a 44 y.o. male here today for a follow up visit. Patient has no significant past medical history, complaining of extreme fatigue and chronic back pain. Patient denies any pain at the moment but states that pain occurs while walking or working and may be up to a grade of 8 out of 10. Patient reports he is always tired and sleepy, he has been worked up for hypothyroidism and anemia in the past with normal results. Patient denies outright depression at this time but admitted to being depressed in the past. He denies any suicidal ideation or thoughts. Patient claims he does not sleep well at night, he denies any snoring or apnea but rather not able to go to sleep because of excessive thoughts. Patient has No headache, No chest pain, No abdominal pain - No Nausea, No new weakness tingling or numbness, No Cough - SOB.  No problems updated.  ALLERGIES: No Known Allergies  PAST MEDICAL HISTORY: History reviewed. No pertinent past medical history.  MEDICATIONS AT HOME: Prior to Admission medications   Medication Sig Start Date End Date Taking? Authorizing Provider  amitriptyline (ELAVIL) 50 MG tablet Take 1 tablet (50 mg total) by mouth at bedtime. 12/04/14   Quentin Angst, MD  cyclobenzaprine (FLEXERIL) 10 MG tablet Take 1 tablet (10 mg total) by mouth 3 (three) times daily as needed for muscle spasms. 12/04/14   Quentin Angst, MD  gemfibrozil (LOPID) 600 MG tablet Take 1 tablet (600 mg total) by mouth 2 (two) times daily before a meal. 12/04/14   Quentin Angst, MD  metaxalone (SKELAXIN) 800 MG tablet Take 1 tablet (800 mg total) by mouth 3 (three) times daily. As muscle relaxer. Patient not taking: Reported on 12/04/2014 11/19/13    Linna Hoff, MD  naproxen (NAPROSYN) 500 MG tablet Take 1 tablet (500 mg total) by mouth 2 (two) times daily with a meal. 12/04/14   Quentin Angst, MD     Objective:   Filed Vitals:   12/04/14 1052  BP: 128/80  Pulse: 74  Temp: 98.5 F (36.9 C)  TempSrc: Oral  Resp: 18  Height: 5' 4.5" (1.638 m)  Weight: 126 lb 12.8 oz (57.516 kg)  SpO2: 97%    Exam General appearance : Awake, alert, not in any distress. Speech Clear. Not toxic looking, thin built HEENT: Atraumatic and Normocephalic, pupils equally reactive to light and accomodation Neck: supple, no JVD. No cervical lymphadenopathy.  Chest:Good air entry bilaterally, no added sounds  CVS: S1 S2 regular, no murmurs.  Abdomen: Bowel sounds present, Non tender and not distended with no gaurding, rigidity or rebound. Extremities: B/L Lower Ext shows no edema, both legs are warm to touch Neurology: Awake alert, and oriented X 3, CN II-XII intact, Non focal Skin:No Rash  Data Review Lab Results  Component Value Date   HGBA1C 5.5 12/26/2013     Assessment & Plan   1. Depression  - amitriptyline (ELAVIL) 50 MG tablet; Take 1 tablet (50 mg total) by mouth at bedtime.  Dispense: 30 tablet; Refill: 3 - TSH - CBC with Differential/Platelet - May need psychiatric evaluation - Patient referred to our licensed  clinical Child psychotherapist for counseling  2. Bilateral low back pain without sciatica  - cyclobenzaprine (FLEXERIL) 10 MG tablet; Take 1 tablet (10 mg total) by mouth 3 (three) times daily as needed for muscle spasms.  Dispense: 60 tablet; Refill: 3 - naproxen (NAPROSYN) 500 MG tablet; Take 1 tablet (500 mg total) by mouth 2 (two) times daily with a meal.  Dispense: 30 tablet; Refill: 0  - gemfibrozil (LOPID) 600 MG tablet; Take 1 tablet (600 mg total) by mouth 2 (two) times daily before a meal.  Dispense: 60 tablet; Refill: 3 - Lipid panel  To address this please limit saturated fat to no more than 7% of your  calories, limit cholesterol to 200 mg/day, increase fiber and exercise as tolerated. If needed we may add another cholesterol lowering medication to your regimen.   Patient have been counseled extensively about nutrition and exercise  Interpreter was used to communicate directly with patient for the entire encounter including providing detailed patient instructions.   Return in about 6 months (around 06/06/2015), or if symptoms worsen or fail to improve, for Follow up Pain and comorbidities.  The patient was given clear instructions to go to ER or return to medical center if symptoms don't improve, worsen or new problems develop. The patient verbalized understanding. The patient was told to call to get lab results if they haven't heard anything in the next week.   This note has been created with Education officer, environmental. Any transcriptional errors are unintentional.    Jeanann Lewandowsky, MD, MHA, CPE, FACP, FAAP [facility] and [facility] [place], [place] [phone]   12/04/2014, 11:27 AM

## 2014-12-04 NOTE — Progress Notes (Signed)
Patient here for fatigue and back pain. Patient denies any pain right now, but states that when he is working his pain in his back is an 8 or 9. Patient reports he is always really tired. Patient states when he is in class it is hard for him to concentrate due to being fatigued. Patient reports he is not taking any medications.

## 2014-12-05 LAB — URINALYSIS, COMPLETE
BILIRUBIN URINE: NEGATIVE
Bacteria, UA: NONE SEEN [HPF]
CASTS: NONE SEEN [LPF]
Crystals: NONE SEEN [HPF]
GLUCOSE, UA: NEGATIVE
Hgb urine dipstick: NEGATIVE
Ketones, ur: NEGATIVE
LEUKOCYTES UA: NEGATIVE
Nitrite: NEGATIVE
PROTEIN: NEGATIVE
RBC / HPF: NONE SEEN RBC/HPF (ref <=2)
Specific Gravity, Urine: 1.005 (ref 1.001–1.035)
Squamous Epithelial / LPF: NONE SEEN [HPF] (ref <=5)
WBC, UA: NONE SEEN WBC/HPF (ref <=5)
YEAST: NONE SEEN [HPF]
pH: 7.5 (ref 5.0–8.0)

## 2014-12-25 ENCOUNTER — Telehealth: Payer: Self-pay | Admitting: *Deleted

## 2014-12-25 NOTE — Telephone Encounter (Signed)
[employer] [phone] phoned patient and gave normal lab results

## 2014-12-25 NOTE — Telephone Encounter (Signed)
-----   Message from Tandy Gaw, RN sent at 12/19/2014  3:01 PM EDT -----   ----- Message -----    From: Quentin Angst, MD    Sent: 12/10/2014  11:58 AM      To: Tandy Gaw, RN  Please inform patient that his lab results all normal

## 2014-12-29 ENCOUNTER — Telehealth: Payer: Self-pay | Admitting: Internal Medicine

## 2014-12-29 NOTE — Telephone Encounter (Signed)
Patient came into facility to request a med refill for naproxen (NAPROSYN) 500 MG tablet. Please f/u with pt.

## 2015-06-22 ENCOUNTER — Telehealth: Payer: Self-pay

## 2015-06-22 ENCOUNTER — Telehealth: Payer: Self-pay | Admitting: *Deleted

## 2015-06-22 NOTE — Telephone Encounter (Signed)
Will call Dennison Nancy for advice on how to proceed with care for this client.

## 2015-06-22 NOTE — Telephone Encounter (Signed)
I have asked [name] to contact patient regarding his Medicaid.  If it is indeed up to date then we will re-process his referral for surgery.

## 2015-06-23 NOTE — Telephone Encounter (Signed)
I look in the Medicaid Website and patient has Medicaid for family planning only

## 2015-10-04 IMAGING — CR DG CHEST 2V
2 series · 2 of 2 positions shown · non-contrast
Comparison: None.

CLINICAL DATA: Cough.  Chronic fatigue

EXAM:
CHEST  2 VIEW

[w chest pa]
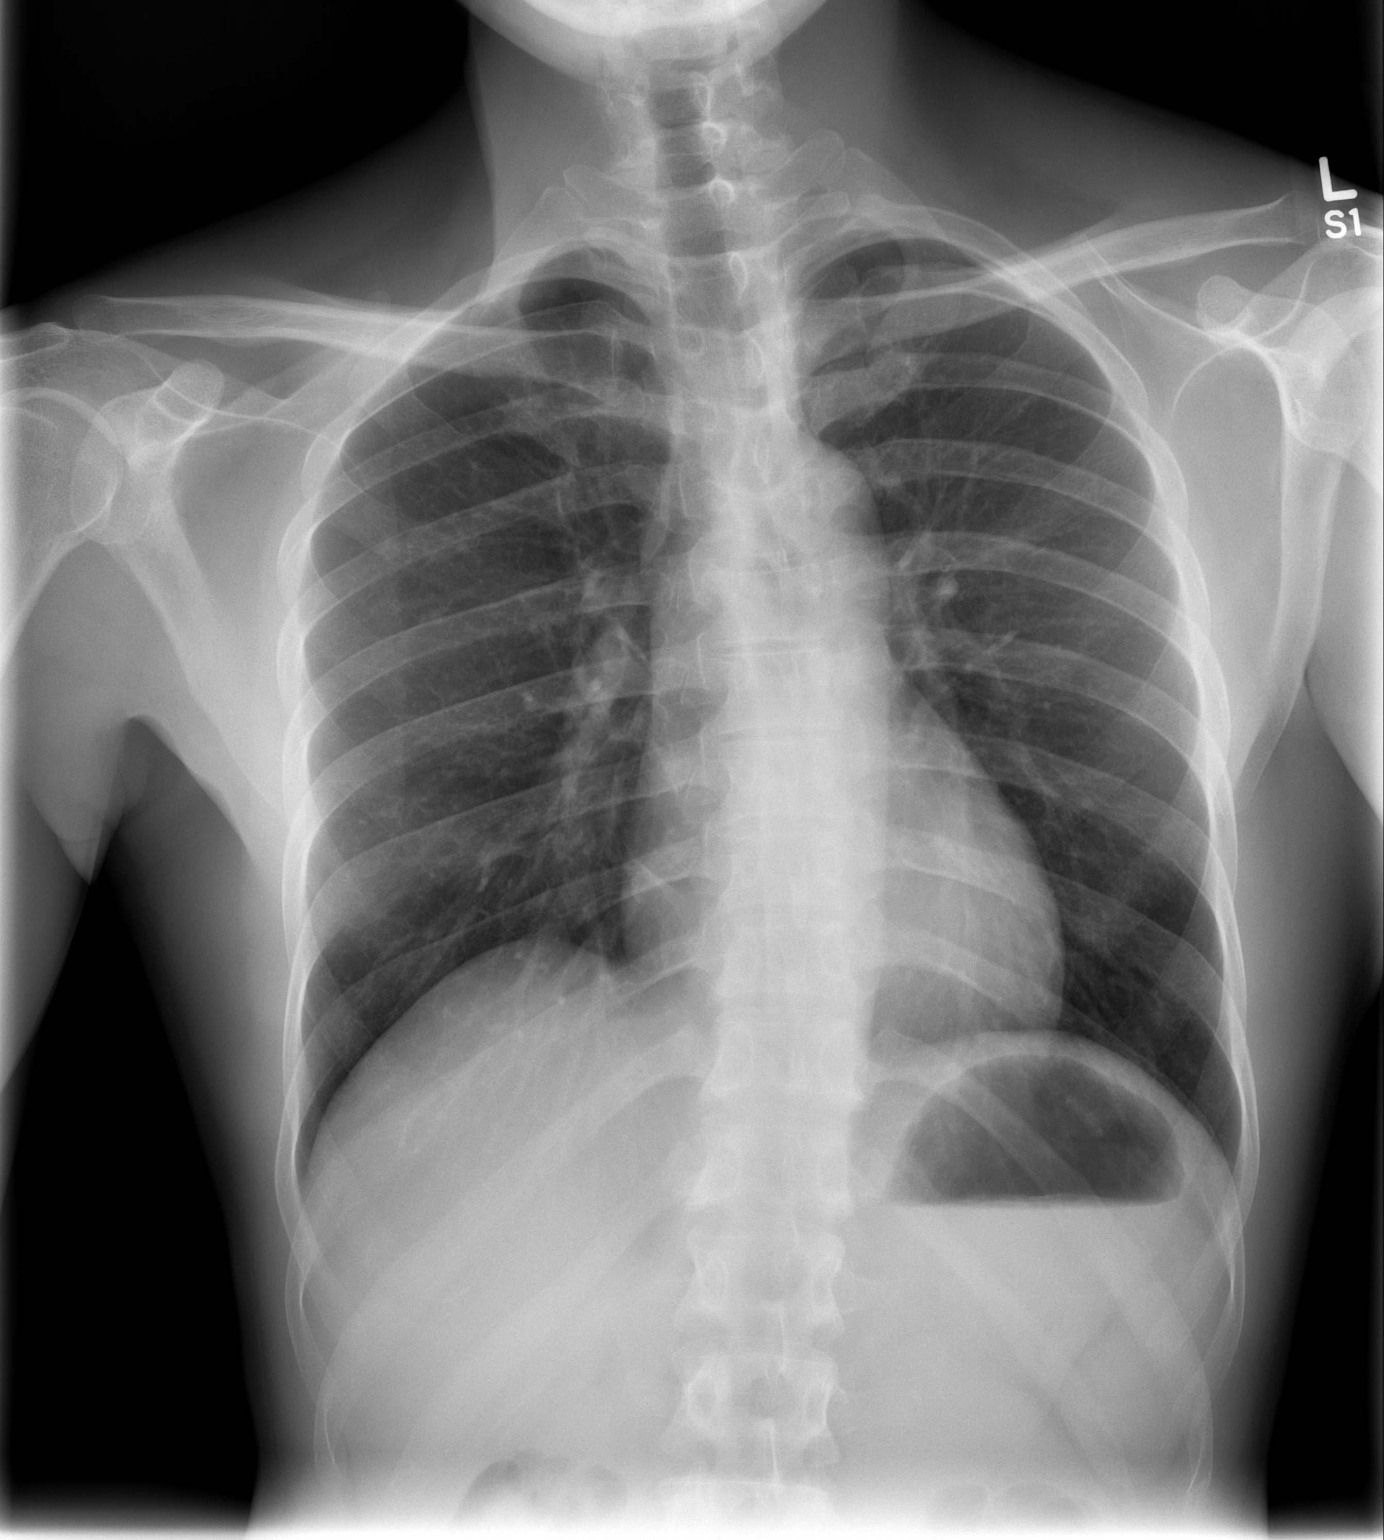

[w chest lat]
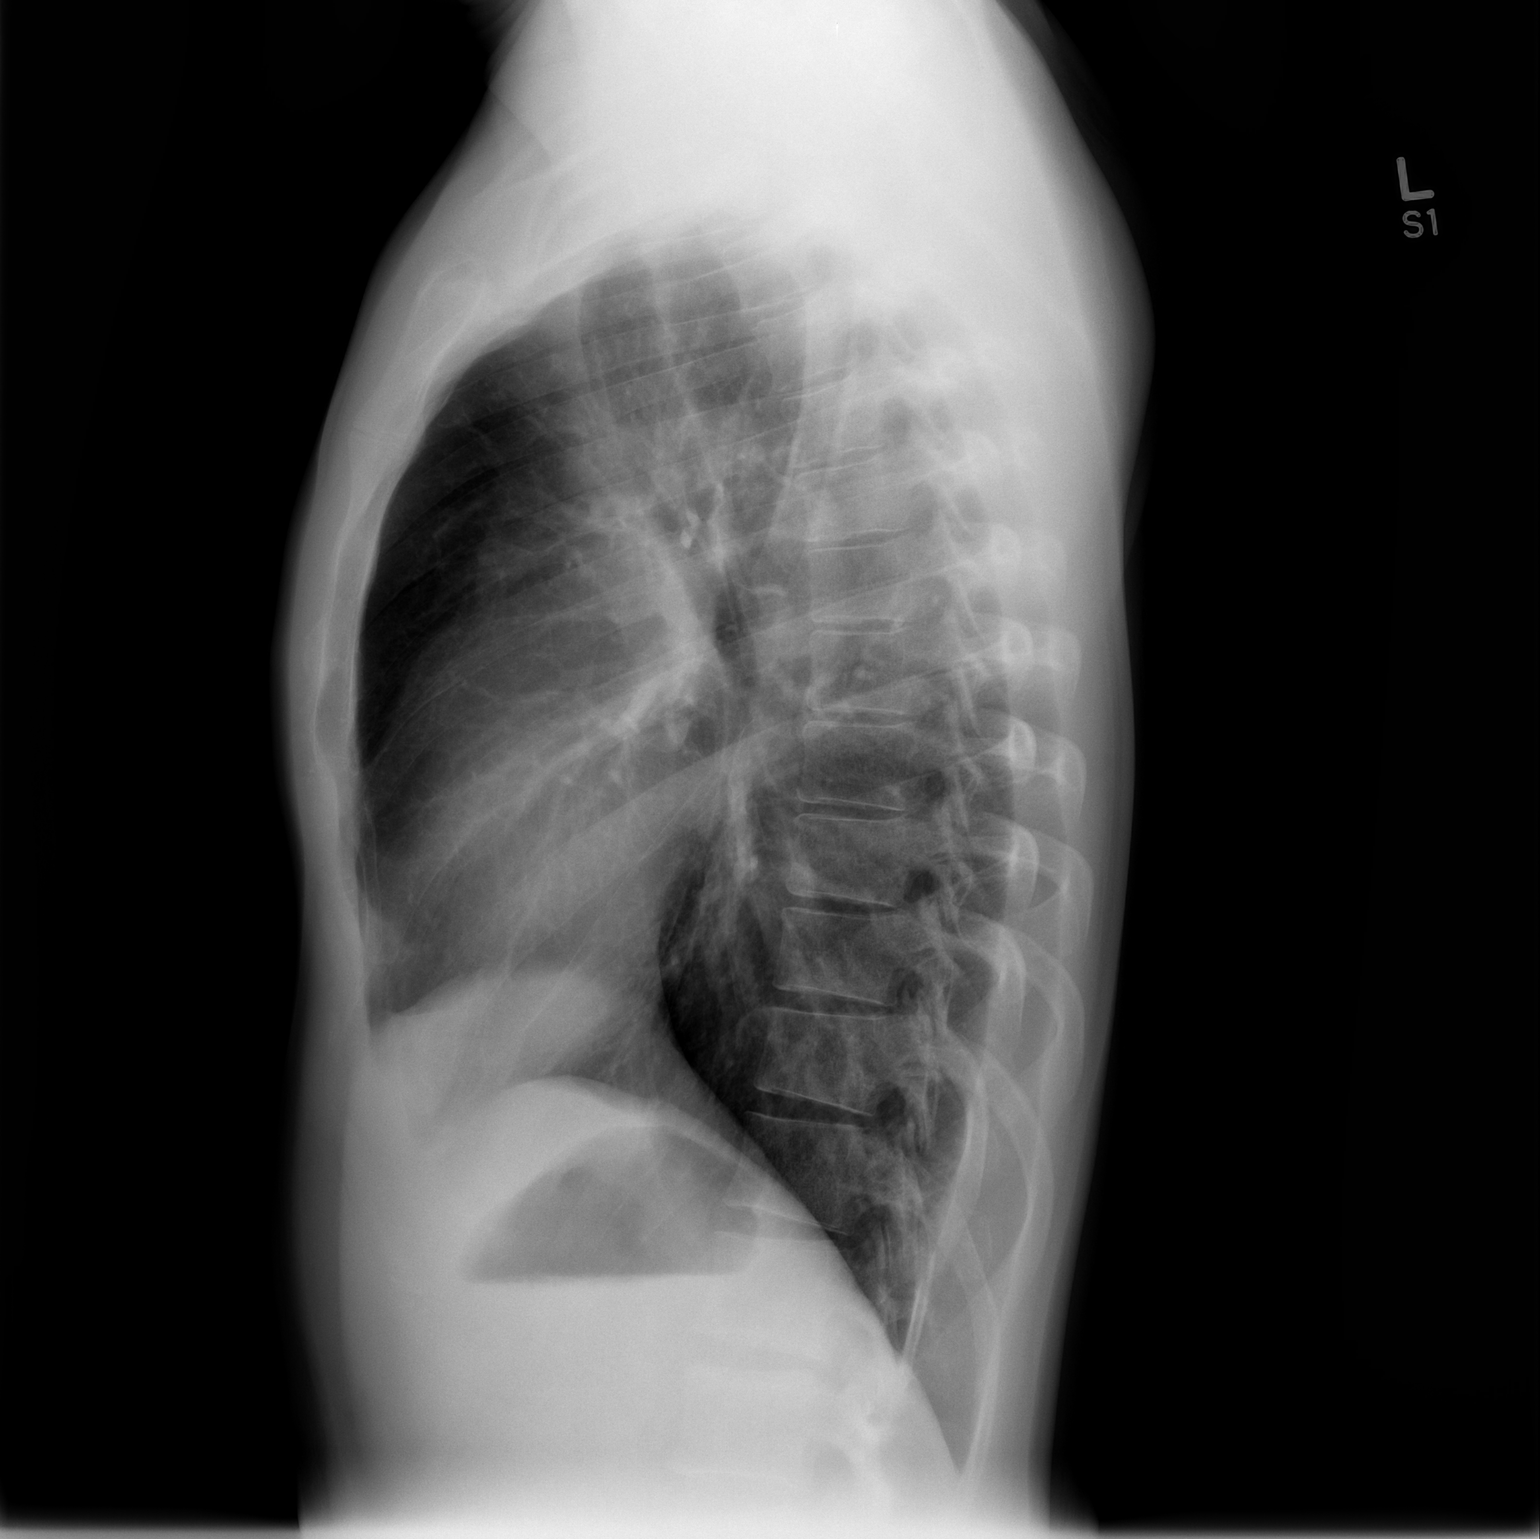

[2 of 2 positions shown; findings below may reference images not displayed]

FINDINGS: The heart size and mediastinal contours are within normal limits.
Both lungs are clear. The visualized skeletal structures are
unremarkable.
IMPRESSION: No active cardiopulmonary disease.

## 2019-02-28 ENCOUNTER — Other Ambulatory Visit: Payer: Self-pay

## 2019-02-28 DIAGNOSIS — Z20822 Contact with and (suspected) exposure to covid-19: Secondary | ICD-10-CM

## 2019-03-02 LAB — NOVEL CORONAVIRUS, NAA: SARS-CoV-2, NAA: NOT DETECTED

## 2019-08-05 ENCOUNTER — Ambulatory Visit: Payer: Self-pay | Attending: Internal Medicine

## 2019-08-05 DIAGNOSIS — Z23 Encounter for immunization: Secondary | ICD-10-CM

## 2019-08-05 NOTE — Progress Notes (Signed)
   Covid-19 Vaccination Clinic  Name:  Camdon Saetern    MRN: [id number] DOB: 01/02/1971  08/05/2019  Mr. Osmany was observed post Covid-19 immunization for 15 minutes without incident. He was provided with Vaccine Information Sheet and instruction to access the V-Safe system.   Mr. Cleveland was instructed to call 911 with any severe reactions post vaccine: Marland Kitchen Difficulty breathing  . Swelling of face and throat  . A fast heartbeat  . A bad rash all over body  . Dizziness and weakness

## 2019-10-11 ENCOUNTER — Encounter: Payer: Self-pay | Admitting: *Deleted

## 2019-10-11 NOTE — Congregational Nurse Program (Signed)
  Dept: [phone]   Congregational Nurse Program Note  Date of Encounter: 10/11/2019  Past Medical History: No past medical history on file.  Encounter Details: CNP Questionnaire - 10/11/19 1732      Questionnaire   Patient Status  Refugee    Race  Asian    Location Patient Served At  Not Applicable   [facility]  Not Applicable    Uninsured  Uninsured (Subsequent visits/quarter)    Food  No food insecurities    Housing/Utilities  Yes, have permanent housing    Transportation  No transportation needs    Interpersonal Safety  Yes, feel physically and emotionally safe where you currently live    Medication  No medication insecurities    Medical Provider  Yes    Referrals  Not Applicable    ED Visit Averted  Not Applicable    Life-Saving Intervention Made  Not Applicable     Client came in today for BP screening.  He was on his way to work.  BP 120/79 P 91.  He expressed to issues today.  Roderic Palau, RN, MSN, CNP [phone] Office [phone] Cell

## 2021-02-12 NOTE — Congregational Nurse Program (Signed)
  Dept: [phone]   Congregational Nurse Program Note  Date of Encounter: 10/11/2019  Past Medical History: No past medical history on file.  Encounter Details:

## 2022-06-09 ENCOUNTER — Ambulatory Visit (INDEPENDENT_AMBULATORY_CARE_PROVIDER_SITE_OTHER): Payer: BLUE CROSS/BLUE SHIELD

## 2022-06-09 ENCOUNTER — Ambulatory Visit (HOSPITAL_COMMUNITY)
Admission: EM | Admit: 2022-06-09 | Discharge: 2022-06-09 | Disposition: A | Discharge status: Home / Self Care | Payer: BLUE CROSS/BLUE SHIELD | Attending: Family Medicine | Admitting: Family Medicine

## 2022-06-09 ENCOUNTER — Encounter (HOSPITAL_COMMUNITY): Payer: Self-pay | Admitting: *Deleted

## 2022-06-09 DIAGNOSIS — M25532 Pain in left wrist: Secondary | ICD-10-CM

## 2022-06-09 DIAGNOSIS — M25522 Pain in left elbow: Secondary | ICD-10-CM

## 2022-06-09 DIAGNOSIS — M25562 Pain in left knee: Secondary | ICD-10-CM
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
